# Patient Record
Sex: Male | Born: 1985 | Hispanic: Yes | Marital: Single | Smoking: Never smoker
Health system: Southern US, Community
[De-identification: ages and names within clinical notes are randomized; demographics above are authoritative.]

---

## 2013-01-09 ENCOUNTER — Emergency Department (HOSPITAL_COMMUNITY)
Admission: EM | Admit: 2013-01-09 | Discharge: 2013-01-09 | Disposition: A | Discharge status: Home / Self Care | Payer: Self-pay | Attending: Emergency Medicine | Admitting: Emergency Medicine

## 2013-01-09 ENCOUNTER — Encounter (HOSPITAL_COMMUNITY): Payer: Self-pay | Admitting: *Deleted

## 2013-01-09 ENCOUNTER — Emergency Department (HOSPITAL_COMMUNITY): Payer: Self-pay

## 2013-01-09 DIAGNOSIS — R509 Fever, unspecified: Secondary | ICD-10-CM | POA: Insufficient documentation

## 2013-01-09 DIAGNOSIS — R0602 Shortness of breath: Secondary | ICD-10-CM | POA: Insufficient documentation

## 2013-01-09 DIAGNOSIS — IMO0001 Reserved for inherently not codable concepts without codable children: Secondary | ICD-10-CM | POA: Insufficient documentation

## 2013-01-09 DIAGNOSIS — R000 Tachycardia, unspecified: Secondary | ICD-10-CM | POA: Insufficient documentation

## 2013-01-09 DIAGNOSIS — J3489 Other specified disorders of nose and nasal sinuses: Secondary | ICD-10-CM | POA: Insufficient documentation

## 2013-01-09 DIAGNOSIS — R071 Chest pain on breathing: Secondary | ICD-10-CM | POA: Insufficient documentation

## 2013-01-09 DIAGNOSIS — J189 Pneumonia, unspecified organism: Secondary | ICD-10-CM | POA: Insufficient documentation

## 2013-01-09 DIAGNOSIS — R197 Diarrhea, unspecified: Secondary | ICD-10-CM | POA: Insufficient documentation

## 2013-01-09 LAB — RAPID STREP SCREEN (MED CTR MEBANE ONLY): Streptococcus, Group A Screen (Direct): NEGATIVE

## 2013-01-09 MED ORDER — AZITHROMYCIN 250 MG PO TABS: 250.0000 mg | ORAL_TABLET | Freq: Every day | ORAL | Status: AC

## 2013-01-09 MED ORDER — IBUPROFEN 400 MG PO TABS
600.0000 mg | ORAL_TABLET | Freq: Once | ORAL | Status: AC
  Administered 2013-01-09: 600 mg via ORAL
  Filled 2013-01-09: qty 1

## 2013-01-09 MED ORDER — AZITHROMYCIN 250 MG PO TABS
500.0000 mg | ORAL_TABLET | Freq: Once | ORAL | Status: AC
  Administered 2013-01-09: 500 mg via ORAL
  Filled 2013-01-09: qty 2

## 2013-01-09 MED ORDER — SODIUM CHLORIDE 0.9 % IV BOLUS (SEPSIS)
1000.0000 mL | Freq: Once | INTRAVENOUS | Status: AC
  Administered 2013-01-09: 1000 mL via INTRAVENOUS

## 2013-01-09 NOTE — ED Provider Notes (Signed)
History  This chart was scribed for [name] - PA by Manuela Schwartz, ED scribe. This patient was seen in room TR06C/TR06C and the patient's care was started at 1738.  CSN: [id number] Arrival date & time 01/09/13  1647  First MD Initiated Contact with Patient 01/09/13 1738     Chief Complaint  Patient presents with  . Fever    sore throat   The history is provided by the patient. No language interpreter was used.   HPI Comments: Jackson Levine is a 27 y.o. male who presents to the Emergency Department complaining of constant, gradually worsening, sore throat, fever, onset 2 days ago. He states his sore throat is his main complaint and he has also tried taking tylenol and theraflu w/out relief from either sore throat or fever, last dose 3 hours prior to arrival..  He states associated sx as congestion, body aches, diarrhea this AM, denies any blood in his stool. He denies emesis, cough, abdominal pain. He states some intermittent associated chest pain and SOB.  History reviewed. No pertinent past medical history. History reviewed. No pertinent past surgical history. No family history on file. History  Substance Use Topics  . Smoking status: Not on file  . Smokeless tobacco: Not on file  . Alcohol Use: Not on file    Review of Systems  Constitutional: Positive for fever and chills.  HENT: Positive for congestion and rhinorrhea.   Respiratory: Negative for shortness of breath.   Cardiovascular: Negative for chest pain.  Gastrointestinal: Negative for nausea, vomiting and abdominal pain.  Musculoskeletal: Negative for back pain.  Neurological: Negative for weakness.  All other systems reviewed and are negative.   A complete 10 system review of systems was obtained and all systems are negative except as noted in the HPI and PMH.   Allergies  Review of patient's allergies indicates not on file.  Home Medications   Current Outpatient Rx  Name  Route  Sig  Dispense  Refill  .  acetaminophen (TYLENOL) 500 MG tablet   Oral   Take 1,000 mg by mouth every 6 (six) hours as needed for pain.         . Pseudoeph-Doxylamine-DM-APAP (NYQUIL PO)   Oral   Take 2 capsules by mouth 2 (two) times daily.          Triage Vitals; BP 144/95  Pulse 115  Temp(Src) 97.9 F (36.6 C) (Oral)  Resp 18  SpO2 98% Physical Exam  Nursing note and vitals reviewed. Constitutional: He is oriented to person, place, and time. He appears well-developed and well-nourished. No distress.  HENT:  Head: Normocephalic and atraumatic.  Right Ear: External ear normal.  Left Ear: External ear normal.  Oropharynx is red and inflammed. Tonsils are swollen w/out evidence of abscess. No exudates, uvula is midline. Airway is intact, no signs of ludwigs angina   Eyes: EOM are normal.  Neck: Neck supple. No tracheal deviation present.  Cardiovascular: Normal heart sounds.  Exam reveals no gallop and no friction rub.   No murmur heard. tachycardic  Pulmonary/Chest: Effort normal and breath sounds normal. No respiratory distress. He has no wheezes. He has no rales. He exhibits no tenderness.  Abdominal: Soft. He exhibits no distension. There is no tenderness.  Musculoskeletal: Normal range of motion.  Neurological: He is alert and oriented to person, place, and time.  Skin: Skin is warm and dry.  Psychiatric: He has a normal mood and affect. His behavior is normal.    ED  Course  Procedures (including critical care time) DIAGNOSTIC STUDIES: Oxygen Saturation is 98% on room air, normal by my interpretation.    COORDINATION OF CARE: At 450 PM Discussed treatment plan with patient which includes CXR, rapid strept screen. Patient agrees 645 PM - Pt states that he has been smoking hookah frequently past several weeks.   Labs Reviewed  RAPID STREP SCREEN   Results for orders placed during the hospital encounter of 01/09/13  RAPID STREP SCREEN      Result Value Range   Streptococcus, Group A  Screen (Direct) NEGATIVE  NEGATIVE   Dg Chest 2 View  01/09/2013   *RADIOLOGY REPORT*  Clinical Data: Chest pain and cough.  Fever.  CHEST - 2 VIEW  Comparison: None.  Findings: Midline trachea.  Normal heart size and mediastinal contours. No pleural effusion or pneumothorax.  Patchy left lower lobe retrocardiac airspace disease.  Right lung clear.  IMPRESSION: Left lower lobe airspace disease, most consistent with infection.   Original Report Authenticated By: Jeronimo Greaves, M.D.     1. CAP (community acquired pneumonia)     MDM  Patient with community-acquired pneumonia. He feels much better after receiving 2 L of fluid in the emergency department. Vital signs have stabilized.  Patient is able to ambulate in the ED with maintaining O2 saturation >95.  I feel that the patient is stable for discharge, and can be managed on an outpatient basis.  Discharge with Azithromycin.  Filed Vitals:   01/09/13 2006  BP: 135/86  Pulse: 102  Temp: 98.4 F (36.9 C)  Resp: [address] [place] 01/09/13 2023

## 2013-01-09 NOTE — ED Notes (Addendum)
C/o body aches w/sore throat, congestion and felt had fever - did not check. States took Tylenol and Theraflu w/relief but then symptoms return. Last dose 3 hours prior to arrival.

## 2013-01-09 NOTE — ED Notes (Signed)
O2 did not drop below 95% when ambulating. Pt. Denied SOB.

## 2013-01-10 NOTE — ED Provider Notes (Signed)
Medical screening examination/treatment/procedure(s) were performed by non-physician practitioner and as supervising physician I was immediately available for consultation/collaboration.  Doug Sou, MD 01/10/13 [phone]

## 2013-01-11 LAB — CULTURE, GROUP A STREP

## 2013-12-20 IMAGING — CR DG CHEST 2V
2 series · 2 of 2 positions shown · non-contrast
Comparison: None.

CLINICAL DATA: Chest pain and cough.  Fever.

CHEST - 2 VIEW

[w chest pa]
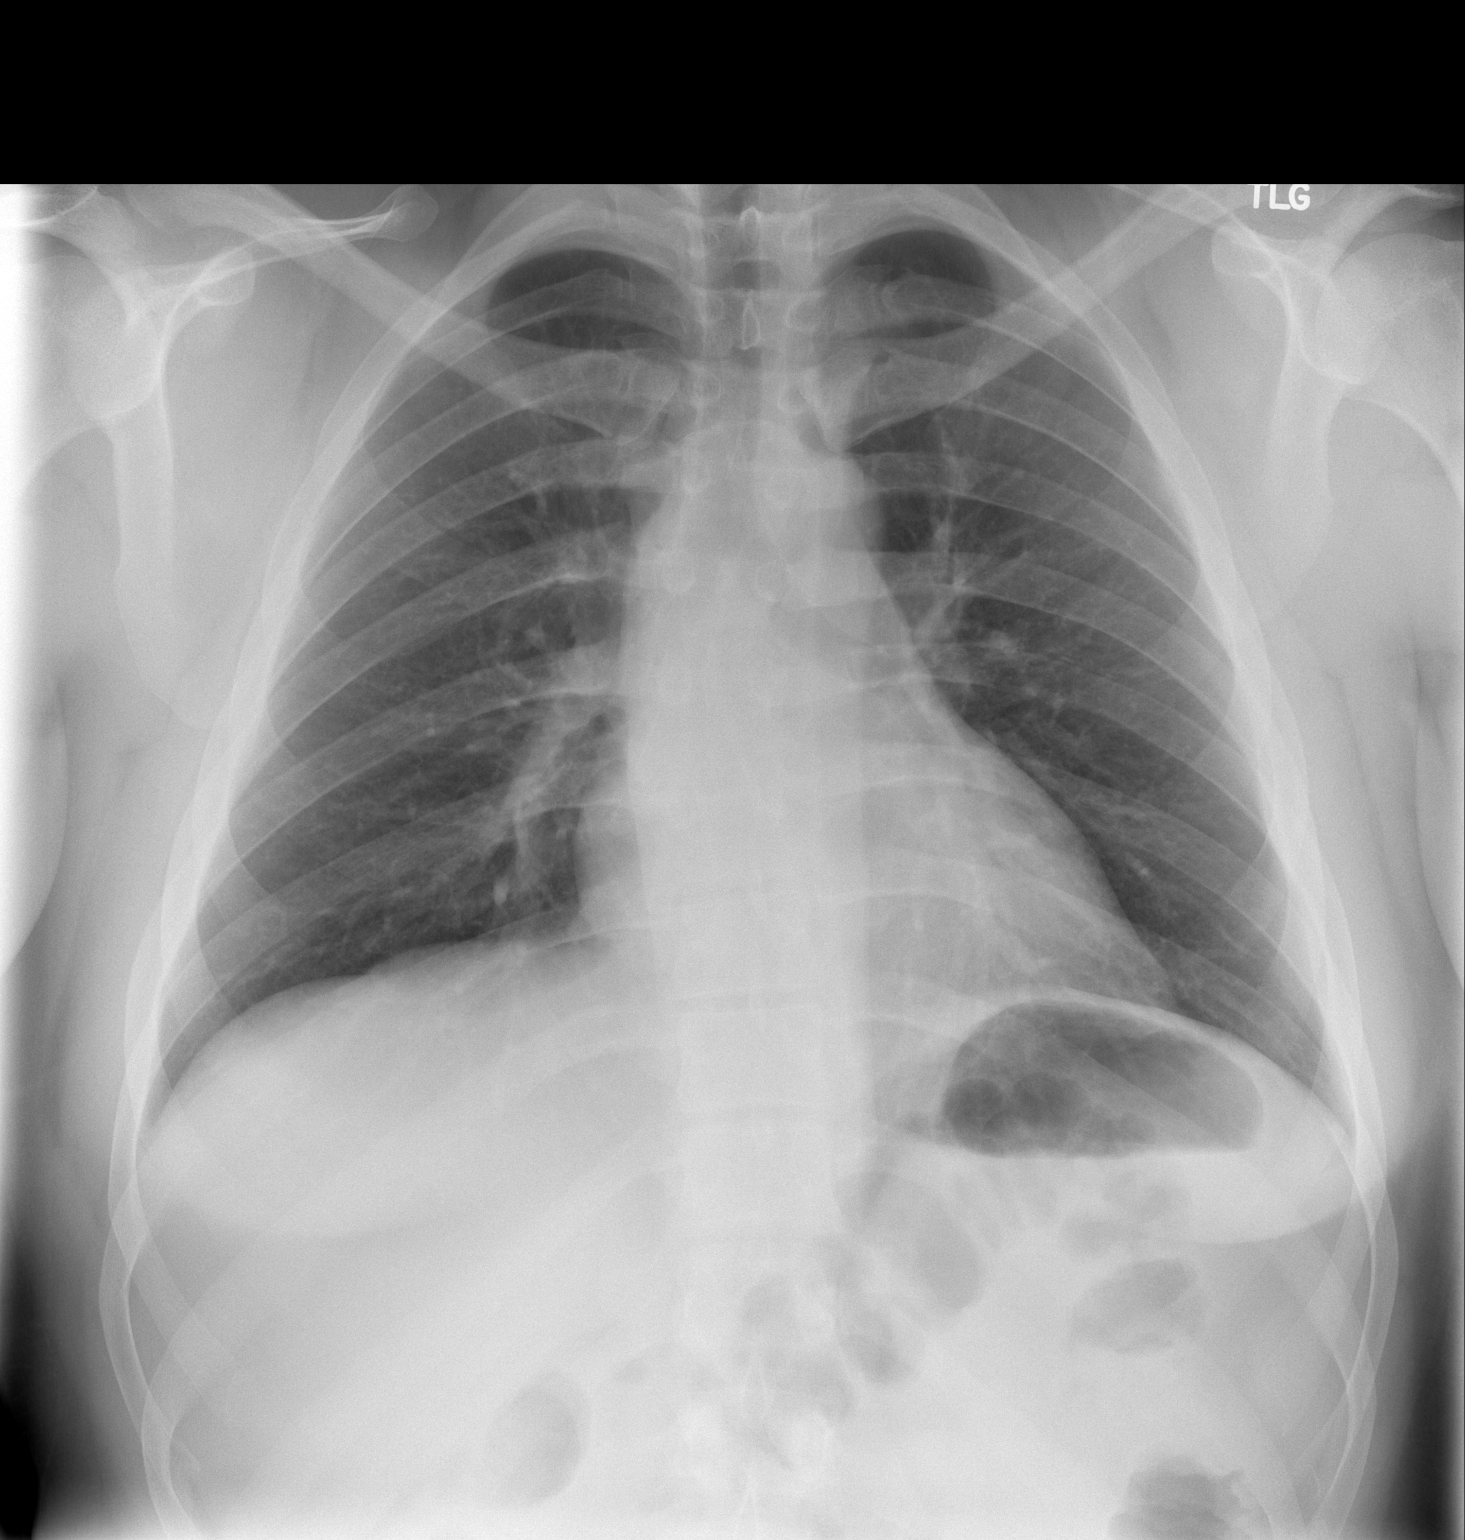

[w chest lat]
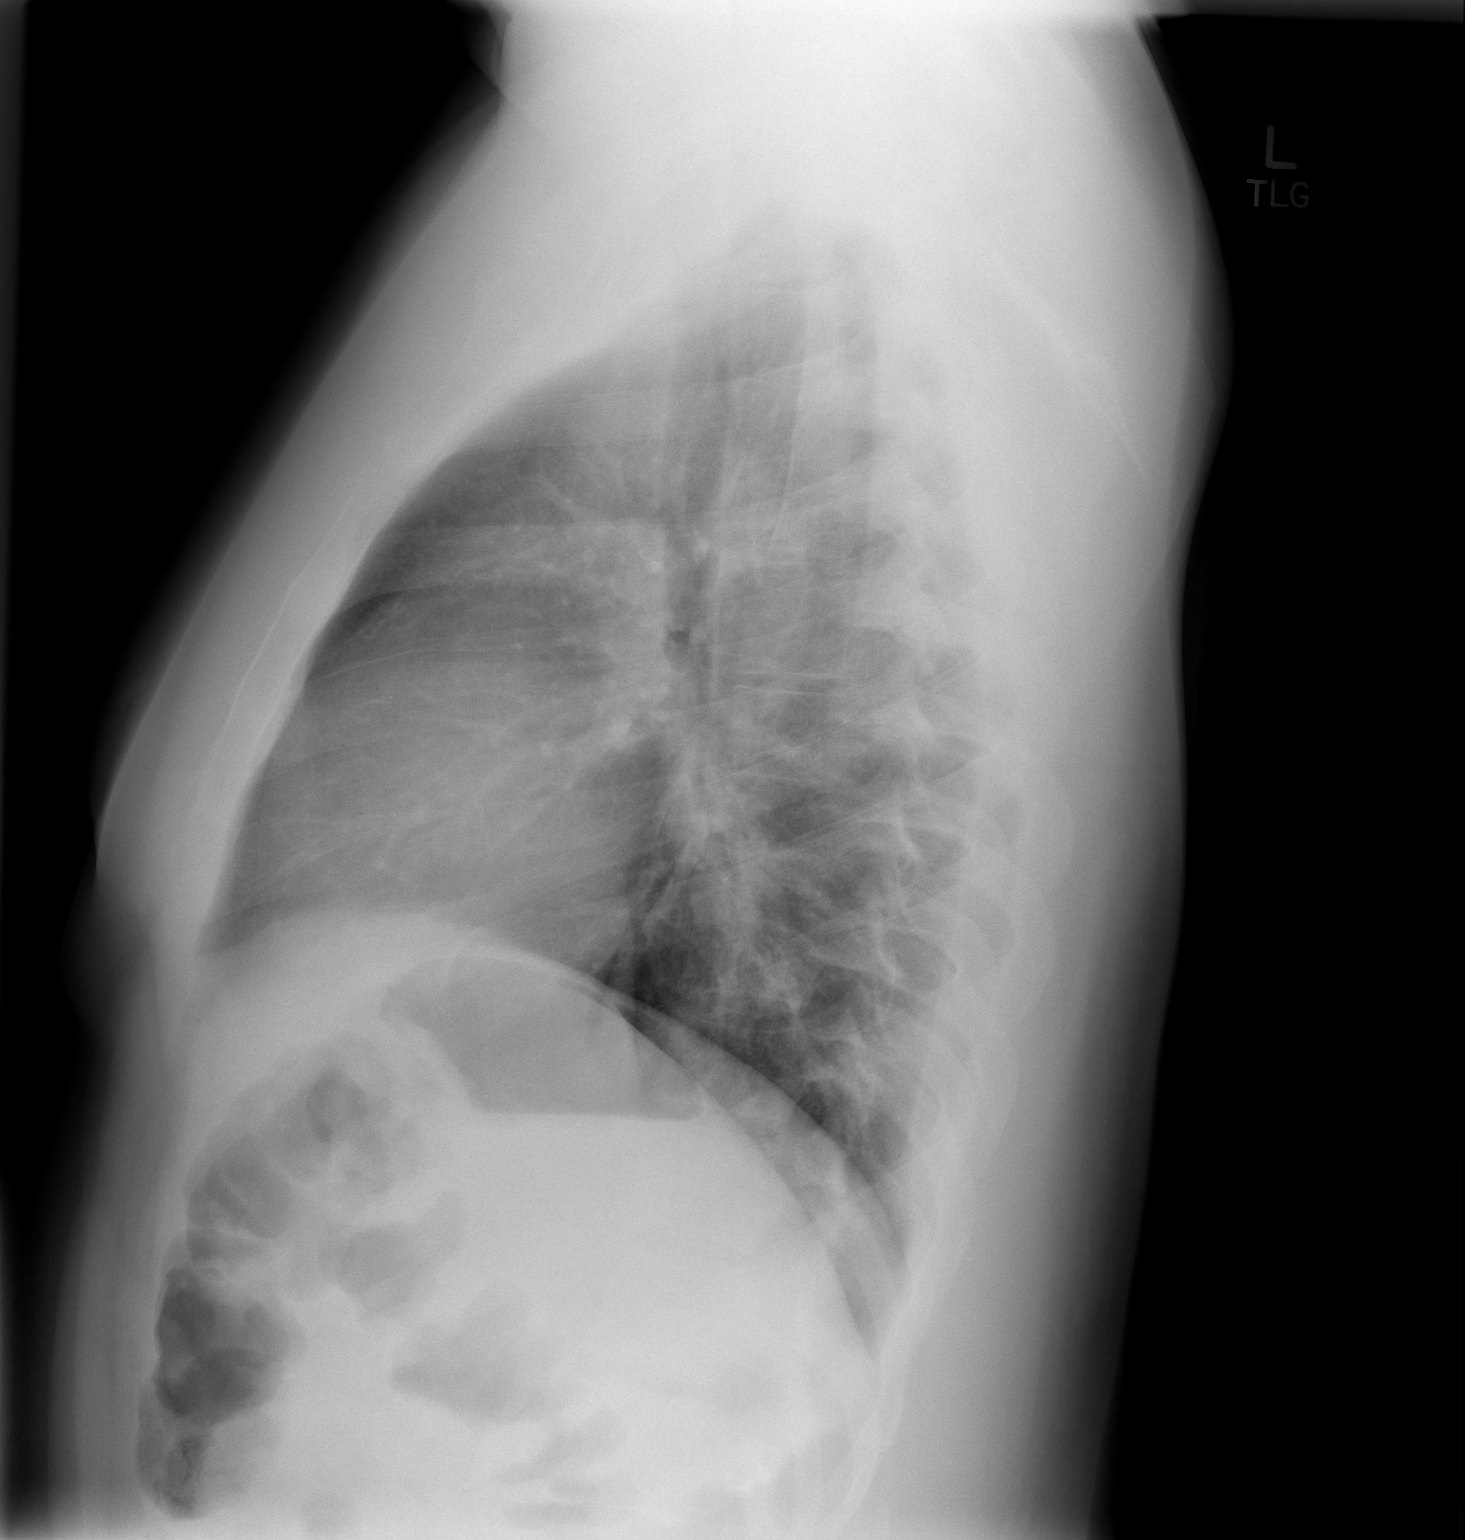

[2 of 2 positions shown; findings below may reference images not displayed]

FINDINGS: Midline trachea.  Normal heart size and mediastinal
contours. No pleural effusion or pneumothorax.  Patchy left lower
lobe retrocardiac airspace disease.  Right lung clear.
IMPRESSION: Left lower lobe airspace disease, most consistent with infection.

## 2015-06-16 ENCOUNTER — Encounter (HOSPITAL_COMMUNITY): Payer: Self-pay | Admitting: Family Medicine

## 2015-06-16 ENCOUNTER — Emergency Department (HOSPITAL_COMMUNITY): Payer: Self-pay

## 2015-06-16 ENCOUNTER — Emergency Department (HOSPITAL_COMMUNITY)
Admission: EM | Admit: 2015-06-16 | Discharge: 2015-06-16 | Disposition: A | Discharge status: Home / Self Care | Payer: Self-pay | Attending: Emergency Medicine | Admitting: Emergency Medicine

## 2015-06-16 DIAGNOSIS — M5442 Lumbago with sciatica, left side: Secondary | ICD-10-CM | POA: Insufficient documentation

## 2015-06-16 MED ORDER — OXYCODONE-ACETAMINOPHEN 5-325 MG PO TABS
1.0000 | ORAL_TABLET | Freq: Once | ORAL | Status: AC
  Administered 2015-06-16: 1 via ORAL
  Filled 2015-06-16: qty 1

## 2015-06-16 MED ORDER — OXYCODONE-ACETAMINOPHEN 5-325 MG PO TABS: 2.0000 | ORAL_TABLET | ORAL | Status: AC | PRN

## 2015-06-16 MED ORDER — KETOROLAC TROMETHAMINE 60 MG/2ML IM SOLN
60.0000 mg | Freq: Once | INTRAMUSCULAR | Status: AC
  Administered 2015-06-16: 60 mg via INTRAMUSCULAR
  Filled 2015-06-16: qty 2

## 2015-06-16 MED ORDER — DIAZEPAM 5 MG PO TABS
5.0000 mg | ORAL_TABLET | Freq: Once | ORAL | Status: AC
  Administered 2015-06-16: 5 mg via ORAL
  Filled 2015-06-16: qty 1

## 2015-06-16 MED ORDER — NAPROXEN 500 MG PO TABS: 500.0000 mg | ORAL_TABLET | Freq: Two times a day (BID) | ORAL | Status: AC

## 2015-06-16 NOTE — Discharge Instructions (Signed)
Sciatica Follow up with a primary care provider using the resource Below. Return for any urinary or bowel incontinence or retention. Sciatica is pain, weakness, numbness, or tingling along your sciatic nerve. The nerve starts in the lower back and runs down the back of each leg. Nerve damage or certain conditions pinch or put pressure on the sciatic nerve. This causes the pain, weakness, and other discomforts of sciatica. HOME CARE   Only take medicine as told by your doctor.  Apply ice to the affected area for 20 minutes. Do this 3-4 times a day for the first 48-72 hours. Then try heat in the same way.  Exercise, stretch, or do your usual activities if these do not make your pain worse.  Go to physical therapy as told by your doctor.  Keep all doctor visits as told.  Do not wear high heels or shoes that are not supportive.  Get a firm mattress if your mattress is too soft to lessen pain and discomfort. GET HELP RIGHT AWAY IF:   You cannot control when you poop (bowel movement) or pee (urinate).  You have more weakness in your lower back, lower belly (pelvis), butt (buttocks), or legs.  You have redness or puffiness (swelling) of your back.  You have a burning feeling when you pee.  You have pain that gets worse when you lie down.  You have pain that wakes you from your sleep.  Your pain is worse than past pain.  Your pain lasts longer than 4 weeks.  You are suddenly losing weight without reason. MAKE SURE YOU:   Understand these instructions.  Will watch this condition.  Will get help right away if you are not doing well or get worse.   This information is not intended to replace advice given to you by your health care provider. Make sure you discuss any questions you have with your health care provider.   Document Released: 03/30/2008 Document Revised: 03/12/2015 Document Reviewed: 10/31/2011 Elsevier Interactive Patient Education 2016 [employer].  Emergency  Department Resource Guide 1) Find a Doctor and Pay Out of Pocket Although you won't have to find out who is covered by your insurance plan, it is a good idea to ask around and get recommendations. You will then need to call the office and see if the doctor you have chosen will accept you as a new patient and what types of options they offer for patients who are self-pay. Some doctors offer discounts or will set up payment plans for their patients who do not have insurance, but you will need to ask so you aren't surprised when you get to your appointment.  2) Contact Your Local Health Department Not all health departments have doctors that can see patients for sick visits, but many do, so it is worth a call to see if yours does. If you don't know where your local health department is, you can check in your phone book. The CDC also has a tool to help you locate your state's health department, and many state websites also have listings of all of their local health departments.  3) Find a Walk-in Clinic If your illness is not likely to be very severe or complicated, you may want to try a walk in clinic. These are popping up all over the country in pharmacies, drugstores, and shopping centers. They're usually staffed by nurse practitioners or physician assistants that have been trained to treat common illnesses and complaints. They're usually fairly quick and inexpensive.  However, if you have serious medical issues or chronic medical problems, these are probably not your best option.  No Primary Care Doctor: - Call Health Connect at  [phone] - they can help you locate a primary care doctor that  accepts your insurance, provides certain services, etc. - Physician Referral Service- [phone]  Chronic Pain Problems: Organization         Address  Phone   Notes  [facility]  [phone]) [phone] Patients need to be referred by their primary care doctor.   Medication  Assistance: Organization         Address  Phone   Notes  [facility] Medication [facility] [address] [place], [place] [address] [phone]) [phone] --Must be a resident of [place] [facility] -- Must have NO insurance coverage whatsoever (no Medicaid/ Medicare, etc.) -- The pt. MUST have a primary care doctor that directs their care regularly and follows them in the community   MedAssist  [phone]   [employer]  [phone]) [phone]    Agencies that provide inexpensive medical care: Organization         Address  Phone   Notes  [facility]  [phone]   [facility]    [phone]   [facility] [address] [place], [place] [address] [phone]) [phone]   [facility] [address], [place] [phone]   Planned Parenthood    [phone]   [facility]    [phone]   [facility] and [facility]  [address], [place] Phone:  [phone](336) [phone], Fax:  [phone] Hours of Operation:  9 am - 6 pm, M-F.  Also accepts Medicaid/Medicare and self-pay.  [facility]  [address], [place] Phone: [phone]) [phone], Fax: [phone]. Hours of Operation:  8:30 am - 5:30 pm, M-F.  Also accepts Medicaid and self-pay.  [facility] [place] [address], [place] Phone: [phone](336) [phone]   [facility] [address], [place], [place] [phone], Ext. [phone] Mondays & Thursdays: 7-9 AM.  First 15 patients are seen on a first come, first serve basis.    Medicaid-accepting [facility] Providers:  Organization         Address  Phone   Notes  [facility] [address], [place] [phone] Also accepts self-pay patients.  [facility] [address], [place]  [phone]) [phone]   [facility] [address], [place]  [phone]) [phone]   [facility] [address] [place] [address], [place] [phone](336) [phone]   Renaye RakersVeita Bland [address], [place]   [phone]) [phone] Only accepts [place] Access [place] patients after they have their name applied to their card.   Self-Pay (no insurance) in [facility] [place]:  Organization         Address  Phone   Notes  Sickle Cell Patients, [facility] [address], [place] [phone]) [phone]   [facility] [address], [place] [phone]   [facility] [place]  [address], [place] [phone]   [facility]/Dr. Osei-Bonsu  [address] [place] [address], [place] or [phone], [place] [phone] Phone number for both [place] and [place] locations is the same.  [facility]gent [facility] and [facility] [address], [place] [phone]   [facility] [address] [place] or [address] [phone]   [facility] [address] [place] [phone], phone; [phone], fax Sees patients 1st and 3rd Saturday of every month.  Must not qualify for public or private insurance (i.e. Medicaid, Medicare, [place] Health Choice, Veterans' Benefits)  Household income should be no more than 200% of the poverty level The clinic cannot treat you if you are pregnant or think you are pregnant  Sexually transmitted diseases are not treated at the clinic.    Dental Care: Organization         Address  Phone  Notes  [facility] Department of [facility] [place] [facility] [address] [place], [place] [phone] Accepts children up to age 52 who are enrolled in [place] or [place] Health Choice; pregnant women with a Medicaid card; and children who have applied for Medicaid or [place] Health Choice, but were declined, whose parents can pay a reduced fee at time of service.  [facility]  Department of [facility]  [address], [place] [phone] Accepts children up to age 40 who are enrolled in [place] or [place] Health Choice; pregnant women with a Medicaid card; and children who have applied for Medicaid or  Health Choice, but were declined, whose parents can pay a reduced fee at time of service.  [facility]  [address] [place], [place] [phone] Patients are seen by appointment only. Walk-ins are not accepted. [facility] will see patients 61 years of age and older. Monday - Tuesday (8am-5pm) Most Wednesdays (8:30-5pm) $30 per visit, cash only  [facility] Adult Dental Access PROGRAM  [address], [facility] [phone] Patients are seen by appointment only. Walk-ins are not accepted. [facility] will see patients 57 years of age and older. One Wednesday Evening (Monthly: Volunteer Based).  $30 per visit, cash only  [employer]  [phone] for adults; Children under age 1, call Graduate Pediatric Dentistry at [phone]. Children aged 38-14, please call [phone] to request a pediatric application.  Dental services are provided in all areas of dental care including fillings, crowns and bridges, complete and partial dentures, implants, gum treatment, root canals, and extractions. Preventive care is also provided. Treatment is provided to both adults and children. Patients are selected via a lottery and there is often a waiting list.   [facility] [address], [place]  [phone] [email or web address]   [facility] [address] [place], [place] [phone], Ext. [phone] Second and Fourth Thursday of each month, opens at 6:30 AM; Clinic ends at 9 AM.  Patients are seen on a first-come first-served basis, and a limited number are seen during each clinic.   [facility]  [address] [place], [place] [phone]    Eligibility Requirements You must have lived in [place], [place], or [place] counties for at least the last three months.   You cannot be eligible for state or federal sponsored [place], including [employer], [place], or [employer].   You generally cannot be eligible for healthcare insurance through your employer.    How to apply: Eligibility screenings are held every Tuesday and Wednesday afternoon from 1:00 pm until 4:00 pm. You  do not need an appointment for the interview!  [facility] [address] [place], [place] [phone]   [facility] Health Department  [phone]   [facility] Health Department  [phone]   [facility] Health Department  [phone]    Behavioral Health Resources in the Community: Intensive Outpatient Programs Organization         Address  Phone  Notes  [facility] Services [address], [place], [place] [phone]   [facility] [address], [place], [place] [phone]   [facility] [address], [place], [place]  [phone]   [facility] Mental Health [address]  [place], [place] [phone] or [phone]   Substance Abuse Resources Organization         Address  Phone  Notes  Alcohol and Drug Services  [phone]   [facility]  [phone]   [facility]  [phone]   Floydene Flock  [phone]   Residential & Outpatient Substance Abuse Program  [phone]   Psychological Services Organization         Address  Phone  Notes  [facility] Behavioral Health  [phone]   [facility] Services  [phone]   [facility] Mental Health [address], [place] [phone] or [phone]    Mobile Crisis Teams Organization         Address  Phone  Notes  [facility], Mobile Crisis Care Unit  [phone]   [facility]  [address] [place] [address]  [place], [place] [phone]   Doristine Locks [address] [place] [phone]    Self-Help/Support Groups Organization         Address  Phone             Notes  [facility] [place] - variety of support groups  [phone]- I7437963 Call for more information  Narcotics Anonymous (NA), [facility] [address] [place] [address], [employer] [place]  2 meetings at this location   Statistician         Address  Phone  Notes  [facility] [address],    [place]  [phone]   [facility]  [address] [place] [address], [place] [address], [place], [place] [phone]   [facility] [address] [place], [place] [phone] Admissions: 8am-3pm M-F  [facility] [address]    [place], [place] [phone]   [facility] [address] [place], [place], [place] [phone]   [facility] [address]  [place], [place] [phone]   [facility] - Intensive Outpatient [address], [place], [place] [phone]   [facility] [address] [place]  [place], [place] [phone] or [phone]   [facility] ([facility]) [address], [place], [place] [phone] Accepts Medicaid  [facility] [place] [address],  [place] [phone] Substance Abuse/Addiction Treatment   [facility] Organization         Address  Phone  Notes  [facility]  [phone]   Angie Fava, PhD [address] [place], [place]   [phone] or [phone]   [facility]   [address] [place], [place] [phone]   [facility] [address] [place], [place] [phone] Insurance/Medicaid/sponsorship through [employer] and Families [address] [place], [place] [phone] [place], [place] [phone]    Dr. Adele Schilder  [phone]   [facility] Dept. 1) [address] [place] 2) [place] 3)  [place] [address], [place] [phone]  [phone]   [place] [phone] or [phone] (After Hours)

## 2015-06-16 NOTE — ED Provider Notes (Signed)
CSN: [id number]     Arrival date & time 06/16/15  1509 History  By signing my name below, I, Jackson Levine, attest that this documentation has been prepared under the direction and in the presence of [employer], PA-C. Electronically Signed: Tanda Levine, ED Scribe. 06/16/2015. 5:03 PM.   Chief Complaint  Patient presents with  . Back Pain  . Leg Pain   The history is provided by the patient. No language interpreter was used.     HPI Comments: Jackson Levine is a 29 y.o. male who presents to the Emergency Department complaining of sudden onset, intermittent, severe, left lower back pain radiating down left leg x 1 month, constant for the past 3 weeks. No known injury, trauma, or fall. Pt states that he sneezed 1 month ago when he felt immediate pain in his left lower back. The pain subsided after about an hour but returned a couple of days later and has been constant since. Pt was taking 5 mg Tramadol that is prescribed to his mom with some relief but states it has not been giving him relief recently, prompting him to come to the ED. Pt also complains of paresthesias to the left foot. The paresthesias are exacerbated and radiate through entire leg when sitting down for a prolonged period of time. Denies urinary or bowel incontinence, difficulty urinating, constipation, weakness, numbness, fever, chills, or any other associated symptoms. No hx IV drug use, recent prednisone use, or hx of cancer.    History reviewed. No pertinent past medical history. History reviewed. No pertinent past surgical history. History reviewed. No pertinent family history. Social History  Substance Use Topics  . Smoking status: Never Smoker   . Smokeless tobacco: None  . Alcohol Use: Yes    Review of Systems  Constitutional: Negative for fever and chills.  Gastrointestinal: Negative for constipation.       Negative for bowel incontinence  Genitourinary: Negative for difficulty urinating.       Negative for  urinary incontinence  Musculoskeletal: Positive for back pain (Left lower) and arthralgias (Left leg).  Neurological: Negative for weakness and numbness.       + paresthesia    Allergies  Review of patient's allergies indicates no known allergies.  Home Medications   Prior to Admission medications   Medication Sig Start Date End Date Taking? Authorizing Provider  acetaminophen (TYLENOL) 500 MG tablet Take 1,000 mg by mouth every 6 (six) hours as needed for pain.    Historical Provider, MD  azithromycin (ZITHROMAX Z-PAK) 250 MG tablet Take 1 tablet (250 mg total) by mouth daily.  PO day 1, then  PO days 205 01/09/13   [name], PA-C  naproxen (NAPROSYN) 500 MG tablet Take 1 tablet (500 mg total) by mouth 2 (two) times daily. 06/16/15   [name], PA-C  oxyCODONE-acetaminophen (PERCOCET/ROXICET) 5-325 MG tablet Take 2 tablets by mouth every 4 (four) hours as needed for severe pain. 06/16/15   [name], PA-C  Pseudoeph-Doxylamine-DM-APAP (NYQUIL PO) Take 2 capsules by mouth 2 (two) times daily.    Historical Provider, MD   Triage Vitals: BP 120/87 mmHg  Pulse 96  Temp(Src) 98.4 F (36.9 C) (Oral)  Resp 16  Ht  (1.854 m)  Wt 289 lb 6.4 oz (131.271 kg)  BMI 38.19 kg/m2  SpO2 96%   Physical Exam  Constitutional: He is oriented to person, place, and time. He appears well-developed and well-nourished. No distress.  HENT:  Head: Normocephalic and atraumatic.  Eyes:  Conjunctivae and EOM are normal.  Neck: Neck supple. No tracheal deviation present.  Cardiovascular: Normal rate and regular rhythm.   Pulmonary/Chest: Effort normal and breath sounds normal. No respiratory distress.  Musculoskeletal: Normal range of motion.  Left gluteal to foot pain and tingling 2+ DP pulse Full ROM of his leg Ambulatory with a steady gait Pain is worsened with sitting Able to dorsiflex and plantarflex with no pain No saddle anesthesia.  Neurological: He is alert  and oriented to person, place, and time.  Skin: Skin is warm and dry.  Psychiatric: He has a normal mood and affect. His behavior is normal.  Nursing note and vitals reviewed.   ED Course  Procedures (including critical care time)  DIAGNOSTIC STUDIES: Oxygen Saturation is 96% on RA, normal by my interpretation.    COORDINATION OF CARE: 4:03 PM-Discussed treatment plan which includes Rx Percocet and Naproxen with pt at bedside and pt agreed to plan.   Labs Review Labs Reviewed - No data to display  Imaging Review Dg Lumbar Spine 2-3 Views  06/16/2015  CLINICAL DATA:  29 year old male with lumbar spine pain radiating into the left leg for the past month EXAM: LUMBAR SPINE - 2-3 VIEW COMPARISON:  None. FINDINGS: There is no evidence of lumbar spine fracture. Alignment is normal. Intervertebral disc spaces are maintained. IMPRESSION: Negative. Electronically Signed   By: [name]  McCullough M.D.   On: 06/16/2015 16:49     EKG Interpretation None      MDM   Final diagnoses:  Acute back pain with sciatica, left  Pt presents to the ED for left lower back pain radiating down left leg x 1 month after sneezing. No known injury. He shouldn't is afebrile and vital signs are stable. X-ray of the lumbar spine was obtained to look at intravertebral disc space which are normal. No neurological deficits and normal neuro exam.  Patient is ambulatory.  No loss of bowel or bladder control.  No concern for cauda equina.  No fever, night sweats, weight loss, h/o cancer, IVDA, no recent procedure to back. No urinary symptoms suggestive of UTI.  Supportive care and return precaution discussed. Appears safe for discharge at this time. Follow up as indicated in discharge paperwork and discussed with the patient. Medications  ketorolac (TORADOL) injection 60 mg (60 mg Intramuscular Given 06/16/15 1615)  diazepam (VALIUM) tablet 5 mg (5 mg Oral Given 06/16/15 1615)  oxyCODONE-acetaminophen (PERCOCET/ROXICET)  5-325 MG per tablet 1 tablet (1 tablet Oral Given 06/16/15 1615)   Rx: Percocet and naproxen.  I personally performed the services described in this documentation, which was scribed in my presence. The recorded information has been reviewed and is accurate.      Catha GosselinHanna Patel-Mills, PA-C 06/16/15 1855  Gwyneth SproutWhitney Plunkett, MD 06/18/15 2152

## 2015-06-16 NOTE — ED Notes (Signed)
Pt here for 1 month of left lower back pain radiating into left leg. Hurts worse when sitting.

## 2016-05-26 IMAGING — DX DG LUMBAR SPINE 2-3V
3 series · 3 of 3 positions shown · non-contrast
Comparison: None.

CLINICAL DATA: 29-year-old male with lumbar spine pain radiating
into the left leg for the past month

EXAM:
LUMBAR SPINE - 2-3 VIEW

[l-spine ap]
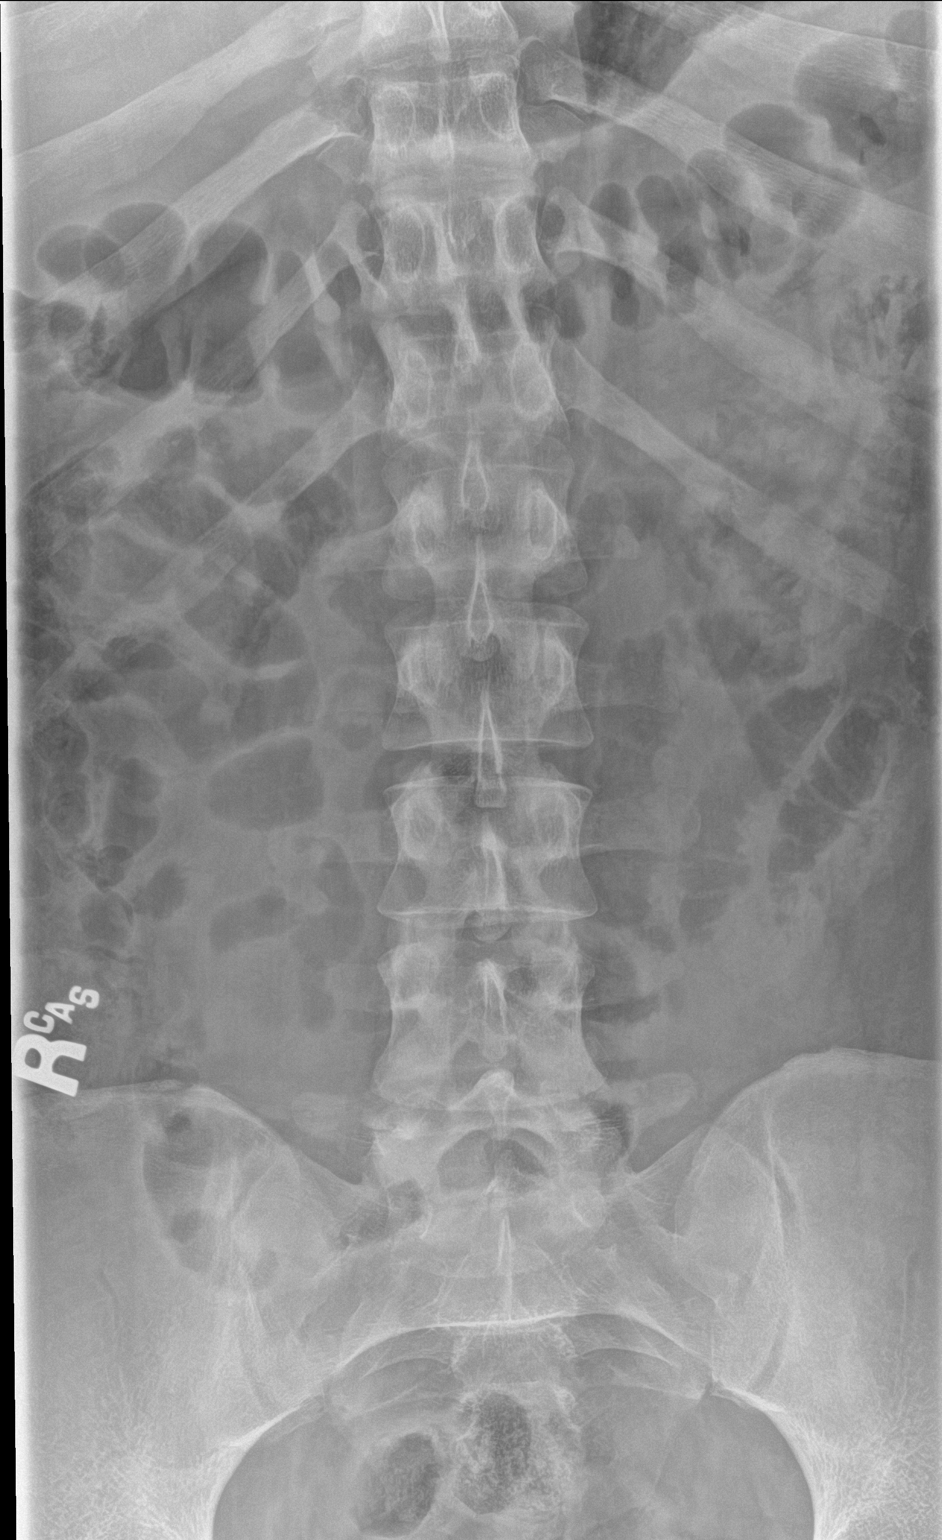

[l-spine lat]
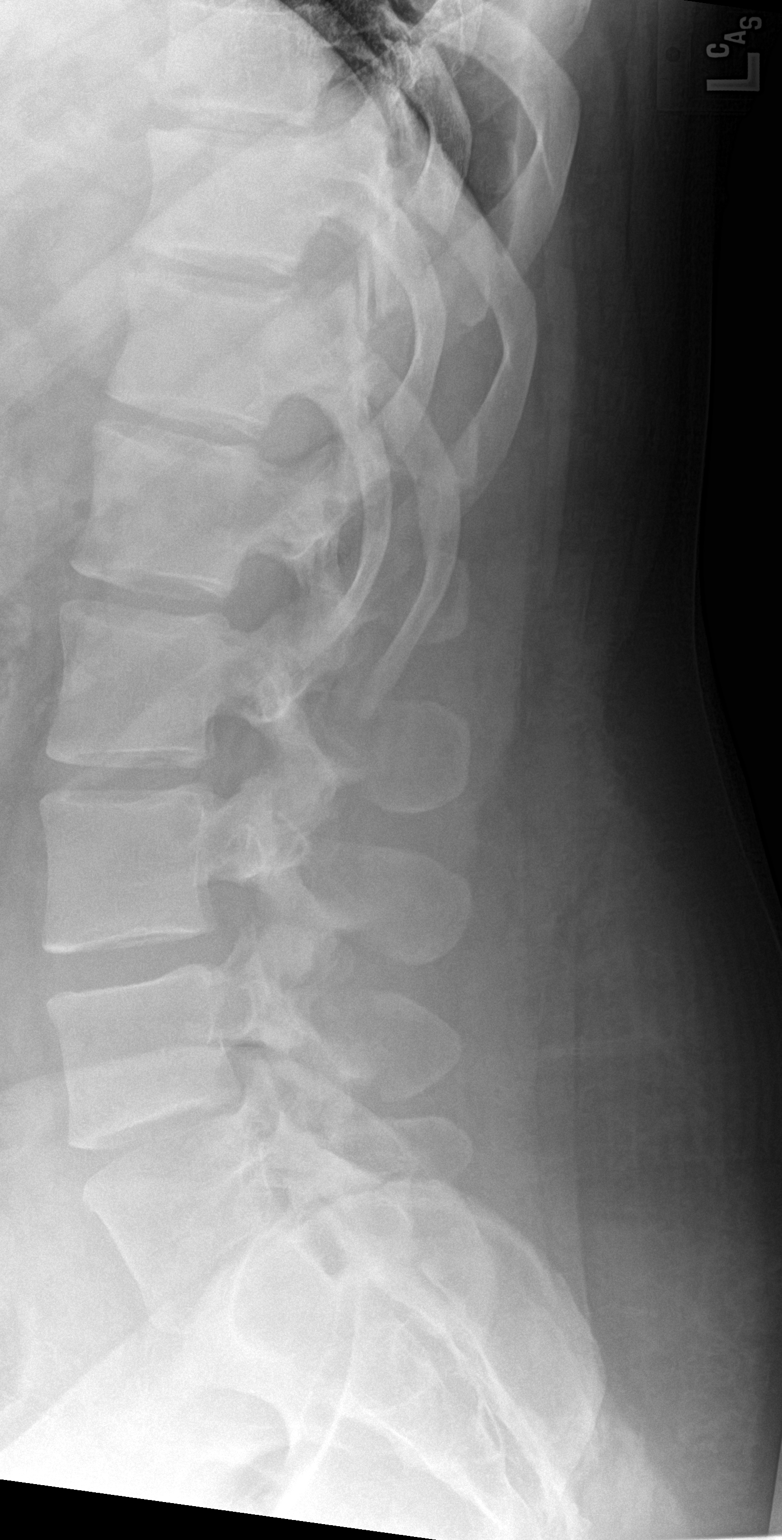

[l-spine spot]
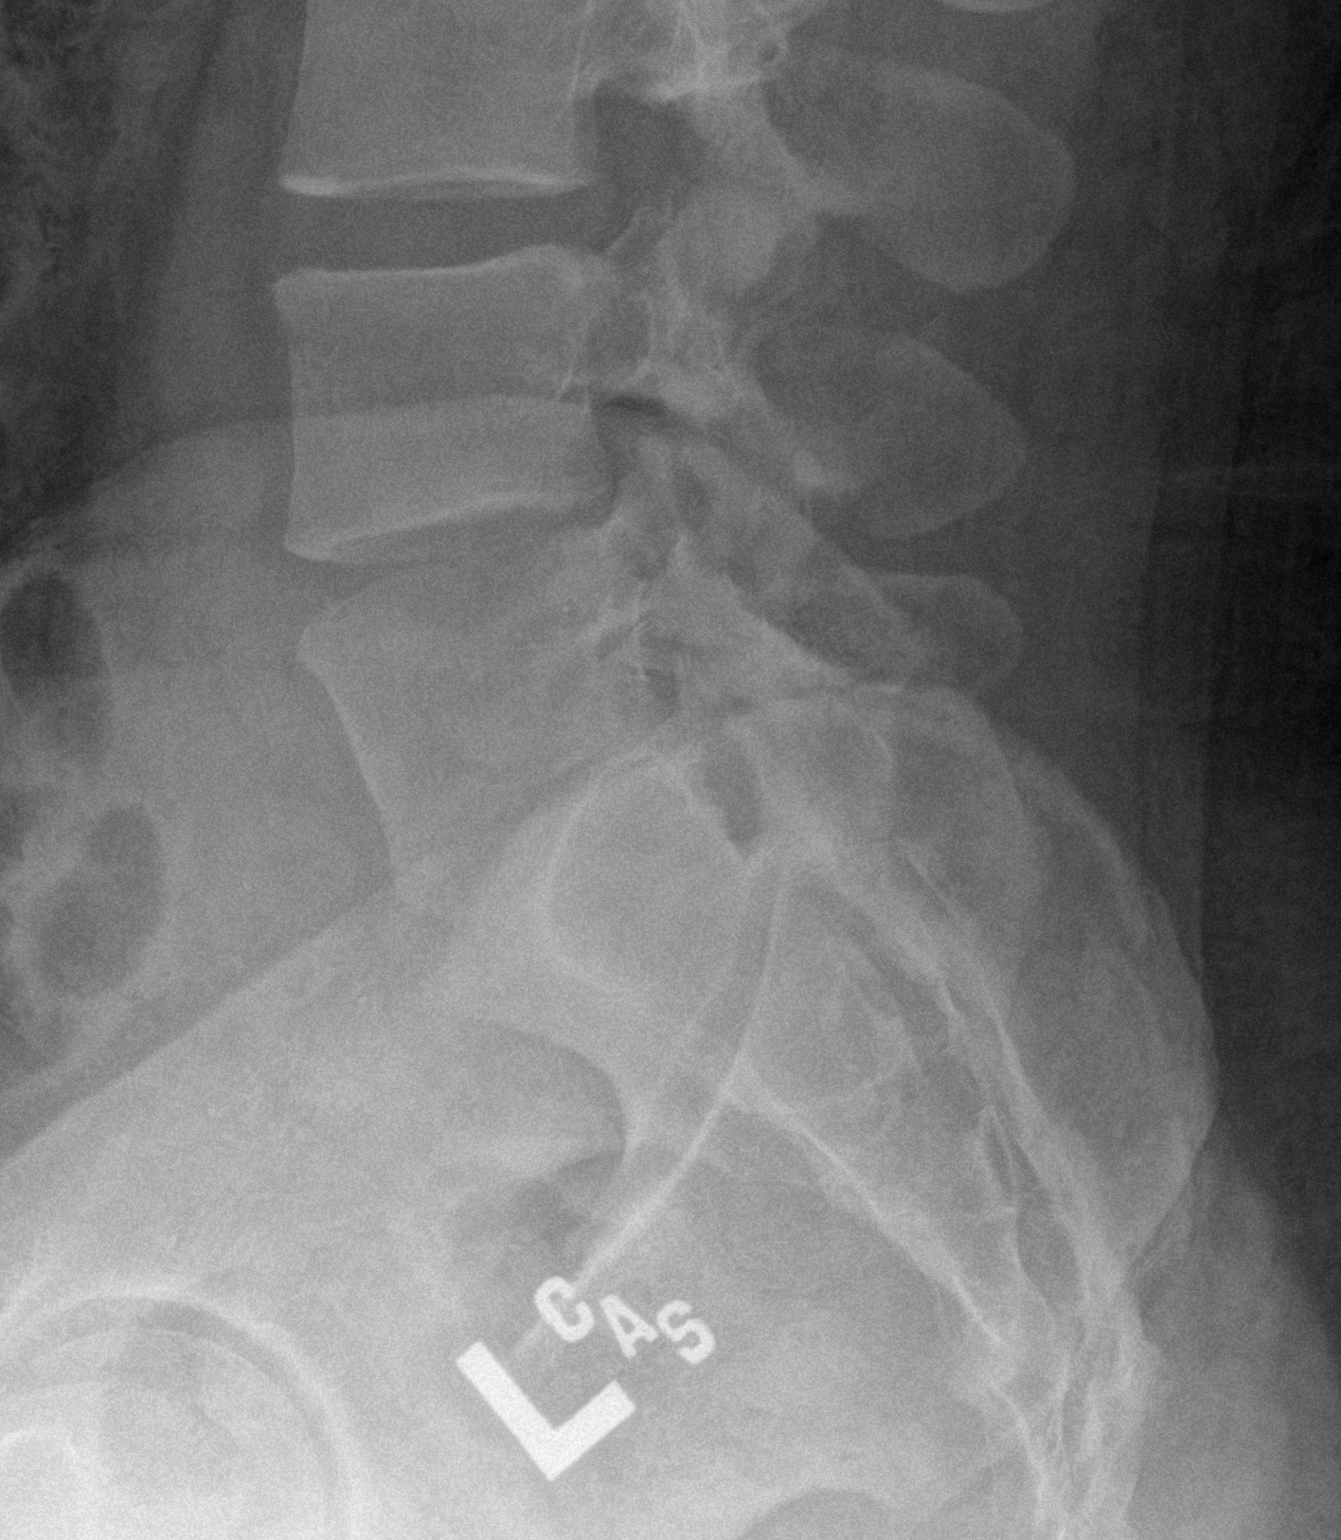

[3 of 3 positions shown; findings below may reference images not displayed]

FINDINGS: There is no evidence of lumbar spine fracture. Alignment is normal.
Intervertebral disc spaces are maintained.
IMPRESSION: Negative.

## 2016-07-08 ENCOUNTER — Emergency Department (HOSPITAL_COMMUNITY)
Admission: EM | Admit: 2016-07-08 | Discharge: 2016-07-08 | Disposition: A | Discharge status: Home / Self Care | Payer: PRIVATE HEALTH INSURANCE | Attending: Emergency Medicine | Admitting: Emergency Medicine

## 2016-07-08 ENCOUNTER — Encounter (HOSPITAL_COMMUNITY): Payer: Self-pay | Admitting: Emergency Medicine

## 2016-07-08 DIAGNOSIS — Y929 Unspecified place or not applicable: Secondary | ICD-10-CM | POA: Diagnosis not present

## 2016-07-08 DIAGNOSIS — Y990 Civilian activity done for income or pay: Secondary | ICD-10-CM | POA: Insufficient documentation

## 2016-07-08 DIAGNOSIS — X500XXA Overexertion from strenuous movement or load, initial encounter: Secondary | ICD-10-CM | POA: Diagnosis not present

## 2016-07-08 DIAGNOSIS — Y939 Activity, unspecified: Secondary | ICD-10-CM | POA: Diagnosis not present

## 2016-07-08 DIAGNOSIS — S3992XA Unspecified injury of lower back, initial encounter: Secondary | ICD-10-CM | POA: Diagnosis present

## 2016-07-08 DIAGNOSIS — Z79899 Other long term (current) drug therapy: Secondary | ICD-10-CM | POA: Insufficient documentation

## 2016-07-08 DIAGNOSIS — S39012A Strain of muscle, fascia and tendon of lower back, initial encounter: Secondary | ICD-10-CM | POA: Diagnosis not present

## 2016-07-08 LAB — URINALYSIS, ROUTINE W REFLEX MICROSCOPIC
Bilirubin Urine: NEGATIVE
Glucose, UA: NEGATIVE mg/dL
HGB URINE DIPSTICK: NEGATIVE
Ketones, ur: NEGATIVE mg/dL
Leukocytes, UA: NEGATIVE
Nitrite: NEGATIVE
Protein, ur: NEGATIVE mg/dL
SPECIFIC GRAVITY, URINE: 1.009 (ref 1.005–1.030)
pH: 6 (ref 5.0–8.0)

## 2016-07-08 LAB — COMPREHENSIVE METABOLIC PANEL
ALK PHOS: 91 U/L (ref 38–126)
ALT: 38 U/L (ref 17–63)
AST: 26 U/L (ref 15–41)
Albumin: 4.2 g/dL (ref 3.5–5.0)
Anion gap: 9 (ref 5–15)
BILIRUBIN TOTAL: 0.8 mg/dL (ref 0.3–1.2)
BUN: 9 mg/dL (ref 6–20)
CALCIUM: 9.5 mg/dL (ref 8.9–10.3)
CO2: 25 mmol/L (ref 22–32)
CREATININE: 0.95 mg/dL (ref 0.61–1.24)
Chloride: 103 mmol/L (ref 101–111)
GFR calc non Af Amer: >60 mL/min (ref >60)
Glucose, Bld: 96 mg/dL (ref 65–99)
Potassium: 3.9 mmol/L (ref 3.5–5.1)
Sodium: 137 mmol/L (ref 135–145)
TOTAL PROTEIN: 7.6 g/dL (ref 6.5–8.1)

## 2016-07-08 LAB — CBC WITH DIFFERENTIAL/PLATELET
Basophils Absolute: 0 10*3/uL (ref 0.0–0.1)
Basophils Relative: 0 %
EOS PCT: 2 %
Eosinophils Absolute: 0.2 10*3/uL (ref 0.0–0.7)
HCT: 46 % (ref 39.0–52.0)
HEMOGLOBIN: 16 g/dL (ref 13.0–17.0)
Lymphocytes Relative: 33 %
Lymphs Abs: 3.6 10*3/uL (ref 0.7–4.0)
MCH: 31.3 pg (ref 26.0–34.0)
MCHC: 34.8 g/dL (ref 30.0–36.0)
MCV: 90 fL (ref 78.0–100.0)
Monocytes Absolute: 0.5 10*3/uL (ref 0.1–1.0)
Monocytes Relative: 5 %
Neutro Abs: 6.7 10*3/uL (ref 1.7–7.7)
Neutrophils Relative %: 60 %
PLATELETS: 348 10*3/uL (ref 150–400)
RBC: 5.11 MIL/uL (ref 4.22–5.81)
RDW: 12.7 % (ref 11.5–15.5)
WBC: 11 10*3/uL — AB (ref 4.0–10.5)

## 2016-07-08 NOTE — ED Triage Notes (Signed)
Pt sts bilateral flank pain x months getting more severe; pt sts some itching with urination

## 2016-07-08 NOTE — Discharge Instructions (Signed)
Take Aleve for pain, as needed. Follow up with an orthopedist for further evaluation of persistent symptoms. You may return for new or concerning symptoms.

## 2016-07-08 NOTE — ED Provider Notes (Signed)
[facility]-EMERGENCY DEPT Provider Note   CSN: [id number] Arrival date & time: 07/08/16  1651     History   Chief Complaint Chief Complaint  Patient presents with  . Flank Pain    HPI Jackson Levine is a 31 y.o. male.  31 year old male presents to the emergency department for evaluation of bilateral flank pain. Patient states that he has had an aching pain primarily in his left flank intermittently over the past year. He is started to notice some pain in his right flank at times. He states that pain is nonradiating and is aggravated with movement. He notices that the pain will worsen after strenuous physical activity and occupational overuse. 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 He does have to lift frequently with this. He has noticed some suprapubic itching which she describes as internal. He has not had any swelling to his foreskin or penile discharge. No bowel/bladder incontinence, dysuria, or hematuria. No recent fevers or vomiting. He is concerned that his symptoms may be due to his kidneys. No hx of kidney stones.   The history is provided by the patient. No language interpreter was used.  Flank Pain     History reviewed. No pertinent past medical history.  There are no active problems to display for this patient.   History reviewed. No pertinent surgical history.    Home Medications    Prior to Admission medications   Medication Sig Start Date End Date Taking? Authorizing Provider  acetaminophen (TYLENOL) 500 MG tablet Take 1,000 mg by mouth every 6 (six) hours as needed for pain.    Historical Provider, MD  azithromycin (ZITHROMAX Z-PAK) 250 MG tablet Take 1 tablet (250 mg total) by mouth daily. 500mg  PO day 1, then 250mg  PO days 205 01/09/13   Roxy Horsemanobert Browning, PA-C  naproxen (NAPROSYN) 500 MG tablet Take 1 tablet (500 mg total) by mouth 2 (two) times daily. 06/16/15   Hanna Patel-Mills, PA-C  oxyCODONE-acetaminophen (PERCOCET/ROXICET) 5-325 MG tablet Take 2  tablets by mouth every 4 (four) hours as needed for severe pain. 06/16/15   Hanna Patel-Mills, PA-C  Pseudoeph-Doxylamine-DM-APAP (NYQUIL PO) Take 2 capsules by mouth 2 (two) times daily.    Historical Provider, MD    Family History History reviewed. No pertinent family history.  Social History Social History  Substance Use Topics  . Smoking status: Never Smoker  . Smokeless tobacco: Not on file  . Alcohol use Yes     Allergies   Patient has no known allergies.   Review of Systems Review of Systems  Genitourinary: Positive for flank pain.  Ten systems reviewed and are negative for acute change, except as noted in the HPI.    Physical Exam Updated Vital Signs BP 131/97 (BP Location: Right Arm)   Pulse 73   Temp 98.1 F (36.7 C) (Oral)   Resp 16   Ht 6\' 2"  (1.88 m)   Wt 108.4 kg   SpO2 100%   BMI 30.69 kg/m   Physical Exam  Constitutional: He is oriented to person, place, and time. He appears well-developed and well-nourished. No distress.  Pleasant and nontoxic appearing. Patient in no acute distress.  HENT:  Head: Normocephalic and atraumatic.  Eyes: Conjunctivae and EOM are normal. No scleral icterus.  Neck: Normal range of motion.  Cardiovascular: Normal rate, regular rhythm and intact distal pulses.   Pulmonary/Chest: Effort normal. No respiratory distress.  Respirations even and unlabored  Abdominal: Soft. He exhibits no distension and no mass. There  is no tenderness. There is no rebound and no guarding.  Soft nontender  Musculoskeletal: Normal range of motion.  TTP to the right lumbar paraspinal muscles. No bony deformities, step-offs, or crepitus to the lumbosacral midline. Negative straight leg raise and crossed straight leg raise.  Neurological: He is alert and oriented to person, place, and time. He exhibits normal muscle tone. Coordination normal.  GCS 15. Patient ambulatory with steady gait. Sensation to light touch intact in bilateral lower  extremities.  Skin: Skin is warm and dry. No rash noted. He is not diaphoretic. No erythema. No pallor.  Psychiatric: He has a normal mood and affect. His behavior is normal.  Nursing note and vitals reviewed.    ED Treatments / Results  Labs (all labs ordered are listed, but only abnormal results are displayed) Labs Reviewed  URINALYSIS, ROUTINE W REFLEX MICROSCOPIC - Abnormal; Notable for the following:       Result Value   Color, Urine STRAW (*)    All other components within normal limits  CBC WITH DIFFERENTIAL/PLATELET - Abnormal; Notable for the following:    WBC 11.0 (*)    All other components within normal limits  COMPREHENSIVE METABOLIC PANEL    EKG  EKG Interpretation None       Radiology No results found.  Procedures Procedures (including critical care time)  Medications Ordered in ED Medications - No data to display   Initial Impression / Assessment and Plan / ED Course  I have reviewed the triage vital signs and the nursing notes.  Pertinent labs & imaging results that were available during my care of the patient were reviewed by me and considered in my medical decision making (see chart for details).  Clinical Course     Patient presenting for intermittent flank pain x 1 year. Symptoms aggravated with movement and strenuous physical activity. Pain is reported as while on palpation. The patient is neurovascularly intact. No loss of bowel or bladder control. No concern for cauda equina. No fever, hx of CA or IVDU. No evidence of UTI today. Kidney function preserved. RICE protocol and pain medicine indicated and discussed with patient. Ortho follow up recommended. Return precautions discussed and provided. Patient discharged in stable condition with no unaddressed concerns.   Final Clinical Impressions(s) / ED Diagnoses   Final diagnoses:  Strain of lumbar paraspinal muscle, initial encounter    New Prescriptions Discharge Medication List as of  07/08/2016  8:22 PM       Antony Madura, PA-C 07/08/16 2104    Lavera Guise, MD 07/09/16 1148
